# Patient Record
Sex: Male | Born: 1995 | Race: White | Hispanic: No | Marital: Single | Smoking: Never smoker
Health system: Southern US, Community
[De-identification: ages and names within clinical notes are randomized; demographics above are authoritative.]

## PROBLEM LIST (undated history)

## (undated) DIAGNOSIS — J45909 Unspecified asthma, uncomplicated: Secondary | ICD-10-CM

---
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
## 2017-03-27 ENCOUNTER — Emergency Department (HOSPITAL_COMMUNITY): Payer: Medicaid Other

## 2017-03-27 ENCOUNTER — Emergency Department (HOSPITAL_COMMUNITY)
Admission: EM | Admit: 2017-03-27 | Discharge: 2017-03-27 | Disposition: A | Discharge status: Home / Self Care | Payer: Medicaid Other | Attending: Emergency Medicine | Admitting: Emergency Medicine

## 2017-03-27 ENCOUNTER — Encounter (HOSPITAL_COMMUNITY): Payer: Self-pay

## 2017-03-27 DIAGNOSIS — S060X0A Concussion without loss of consciousness, initial encounter: Secondary | ICD-10-CM | POA: Insufficient documentation

## 2017-03-27 DIAGNOSIS — Y9302 Activity, running: Secondary | ICD-10-CM | POA: Diagnosis not present

## 2017-03-27 DIAGNOSIS — Y999 Unspecified external cause status: Secondary | ICD-10-CM | POA: Insufficient documentation

## 2017-03-27 DIAGNOSIS — S0083XA Contusion of other part of head, initial encounter: Secondary | ICD-10-CM

## 2017-03-27 DIAGNOSIS — Y92830 Public park as the place of occurrence of the external cause: Secondary | ICD-10-CM | POA: Diagnosis not present

## 2017-03-27 DIAGNOSIS — W228XXA Striking against or struck by other objects, initial encounter: Secondary | ICD-10-CM | POA: Diagnosis not present

## 2017-03-27 DIAGNOSIS — S0990XA Unspecified injury of head, initial encounter: Secondary | ICD-10-CM | POA: Diagnosis present

## 2017-03-27 MED ORDER — KETOROLAC TROMETHAMINE 60 MG/2ML IM SOLN: 60.0000 mg | Freq: Once | INTRAMUSCULAR | Status: DC

## 2017-03-27 MED ORDER — IBUPROFEN 600 MG PO TABS: 600.0000 mg | ORAL_TABLET | Freq: Four times a day (QID) | ORAL | 0 refills | Status: AC | PRN

## 2017-03-27 MED ORDER — KETOROLAC TROMETHAMINE 30 MG/ML IJ SOLN
30.0000 mg | Freq: Once | INTRAMUSCULAR | Status: AC
  Administered 2017-03-27: 30 mg via INTRAVENOUS
  Filled 2017-03-27: qty 1

## 2017-03-27 MED ORDER — ACETAMINOPHEN 500 MG PO TABS
1000.0000 mg | ORAL_TABLET | Freq: Once | ORAL | Status: AC
  Administered 2017-03-27: 1000 mg via ORAL
  Filled 2017-03-27: qty 2
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
## 2017-03-27 NOTE — ED Notes (Signed)
Patient transported to CT 

## 2017-03-27 NOTE — Discharge Instructions (Signed)
Your head CT was reassuring. It is likely that you had a mild concussion. We recommend the use of ibuprofen for headache. Have him placed on brain rest. While on brain rest, we advised that you avoid frequent use of cell phones, computers, and television. Also avoid strenuous activity or heavy lifting. Follow-up with a primary care doctor in 1 week, especially if symptoms are persisting. Return to the emergency department as needed for new or concerning symptoms.

## 2017-03-27 NOTE — ED Notes (Signed)
Pt stable, ambulatory, states understanding of discharge instructions 

## 2017-03-27 NOTE — ED Notes (Signed)
Pt ambulated in hall without difficulty.

## 2017-06-13 ENCOUNTER — Emergency Department: Payer: Medicaid Other

## 2017-06-13 ENCOUNTER — Encounter: Payer: Self-pay | Admitting: Emergency Medicine

## 2017-06-13 DIAGNOSIS — W34010A Accidental discharge of airgun, initial encounter: Secondary | ICD-10-CM | POA: Diagnosis not present

## 2017-06-13 DIAGNOSIS — Z189 Retained foreign body fragments, unspecified material: Secondary | ICD-10-CM | POA: Diagnosis not present

## 2017-06-13 DIAGNOSIS — Z23 Encounter for immunization: Secondary | ICD-10-CM | POA: Diagnosis not present

## 2017-06-13 DIAGNOSIS — J45909 Unspecified asthma, uncomplicated: Secondary | ICD-10-CM | POA: Insufficient documentation

## 2017-06-13 DIAGNOSIS — M25531 Pain in right wrist: Secondary | ICD-10-CM | POA: Insufficient documentation

## 2017-06-14 ENCOUNTER — Emergency Department
Admission: EM | Admit: 2017-06-14 | Discharge: 2017-06-14 | Disposition: A | Discharge status: Home / Self Care | Payer: Medicaid Other | Attending: Emergency Medicine | Admitting: Emergency Medicine

## 2017-06-14 DIAGNOSIS — M25531 Pain in right wrist: Secondary | ICD-10-CM

## 2017-06-14 DIAGNOSIS — Z189 Retained foreign body fragments, unspecified material: Secondary | ICD-10-CM

## 2017-06-14 DIAGNOSIS — W34010A Accidental discharge of airgun, initial encounter: Secondary | ICD-10-CM

## 2017-06-14 HISTORY — DX: Unspecified asthma, uncomplicated: J45.909

## 2017-06-14 MED ORDER — BACITRACIN ZINC 500 UNIT/GM EX OINT: TOPICAL_OINTMENT | CUTANEOUS | 0 refills | Status: AC

## 2017-06-14 MED ORDER — TETANUS-DIPHTH-ACELL PERTUSSIS 5-2.5-18.5 LF-MCG/0.5 IM SUSP
0.5000 mL | Freq: Once | INTRAMUSCULAR | Status: AC
  Administered 2017-06-14: 0.5 mL via INTRAMUSCULAR

## 2017-06-14 MED ORDER — BACITRACIN ZINC 500 UNIT/GM EX OINT
TOPICAL_OINTMENT | Freq: Two times a day (BID) | CUTANEOUS | Status: DC
  Administered 2017-06-14: 1 via TOPICAL
  Filled 2017-06-14: qty 0.9

## 2017-06-14 NOTE — ED Notes (Signed)
Wound cleansed with wound cleanser and antibiotic cream with non stick dressing placed. Pt tolerated well.
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
